# Patient Record
Sex: Male | Born: 1960 | Race: White | Hispanic: No | Marital: Married | Smoking: Former smoker
Health system: Southern US, Community
[De-identification: ages and names within clinical notes are randomized; demographics above are authoritative.]

## PROBLEM LIST (undated history)

## (undated) DIAGNOSIS — G8929 Other chronic pain: Secondary | ICD-10-CM

## (undated) DIAGNOSIS — M25569 Pain in unspecified knee: Secondary | ICD-10-CM

## (undated) DIAGNOSIS — IMO0002 Reserved for concepts with insufficient information to code with codable children: Secondary | ICD-10-CM

## (undated) DIAGNOSIS — M549 Dorsalgia, unspecified: Secondary | ICD-10-CM

## (undated) DIAGNOSIS — E785 Hyperlipidemia, unspecified: Secondary | ICD-10-CM

## (undated) HISTORY — PX: BACK SURGERY: SHX140

## (undated) HISTORY — PX: OTHER SURGICAL HISTORY: SHX169

---

## 2007-08-10 ENCOUNTER — Encounter: Admission: RE | Admit: 2007-08-10 | Discharge: 2007-08-10 | Payer: Self-pay | Admitting: Family Medicine

## 2007-08-10 ENCOUNTER — Ambulatory Visit: Payer: Self-pay | Admitting: Family Medicine

## 2007-08-10 DIAGNOSIS — M545 Low back pain, unspecified: Secondary | ICD-10-CM | POA: Insufficient documentation

## 2007-08-10 DIAGNOSIS — F528 Other sexual dysfunction not due to a substance or known physiological condition: Secondary | ICD-10-CM

## 2007-08-10 DIAGNOSIS — M25569 Pain in unspecified knee: Secondary | ICD-10-CM

## 2007-08-10 DIAGNOSIS — E785 Hyperlipidemia, unspecified: Secondary | ICD-10-CM

## 2007-08-11 ENCOUNTER — Encounter: Payer: Self-pay | Admitting: Family Medicine

## 2007-08-12 ENCOUNTER — Telehealth: Payer: Self-pay | Admitting: Family Medicine

## 2007-08-14 LAB — CONVERTED CEMR LAB
Albumin: 4.5 g/dL (ref 3.5–5.2)
Alkaline Phosphatase: 70 units/L (ref 39–117)
BUN: 17 mg/dL (ref 6–23)
Bilirubin Urine: NEGATIVE
Glucose, Bld: 95 mg/dL (ref 70–99)
HCT: 48.3 % (ref 39.0–52.0)
HDL: 42 mg/dL (ref >39)
Hemoglobin: 16.3 g/dL (ref 13.0–17.0)
LDL Cholesterol: 207 mg/dL — ABNORMAL HIGH (ref 0–99)
MCHC: 33.7 g/dL (ref 30.0–36.0)
MCV: 93.4 fL (ref 78.0–100.0)
Protein, ur: NEGATIVE mg/dL
RBC: 5.17 M/uL (ref 4.22–5.81)
Total Bilirubin: 0.5 mg/dL (ref 0.3–1.2)
Triglycerides: 263 mg/dL — ABNORMAL HIGH (ref <150)
Urine Glucose: NEGATIVE mg/dL
VLDL: 53 mg/dL — ABNORMAL HIGH (ref 0–40)

## 2007-08-22 ENCOUNTER — Telehealth: Payer: Self-pay | Admitting: Family Medicine

## 2007-09-19 ENCOUNTER — Encounter: Payer: Self-pay | Admitting: Family Medicine

## 2008-02-08 ENCOUNTER — Encounter: Admission: RE | Admit: 2008-02-08 | Discharge: 2008-02-08 | Payer: Self-pay | Admitting: Family Medicine

## 2008-02-08 ENCOUNTER — Ambulatory Visit: Payer: Self-pay | Admitting: Family Medicine

## 2008-02-14 ENCOUNTER — Encounter: Payer: Self-pay | Admitting: Family Medicine

## 2008-02-15 ENCOUNTER — Encounter: Payer: Self-pay | Admitting: Family Medicine

## 2008-04-01 ENCOUNTER — Encounter: Payer: Self-pay | Admitting: Family Medicine

## 2008-04-01 LAB — CONVERTED CEMR LAB
Cholesterol: 315 mg/dL
HDL: 35 mg/dL
Triglyceride fasting, serum: 226 mg/dL

## 2008-04-02 ENCOUNTER — Encounter: Payer: Self-pay | Admitting: Family Medicine

## 2008-04-04 ENCOUNTER — Encounter: Payer: Self-pay | Admitting: Family Medicine

## 2009-03-25 ENCOUNTER — Encounter: Admission: RE | Admit: 2009-03-25 | Discharge: 2009-03-25 | Payer: Self-pay | Admitting: Family Medicine

## 2009-03-25 ENCOUNTER — Ambulatory Visit: Payer: Self-pay | Admitting: Family Medicine

## 2009-03-25 DIAGNOSIS — K5900 Constipation, unspecified: Secondary | ICD-10-CM | POA: Insufficient documentation

## 2009-04-04 ENCOUNTER — Encounter: Payer: Self-pay | Admitting: Family Medicine

## 2009-04-18 ENCOUNTER — Encounter: Payer: Self-pay | Admitting: Family Medicine

## 2009-05-10 ENCOUNTER — Encounter: Payer: Self-pay | Admitting: Family Medicine

## 2009-05-13 ENCOUNTER — Encounter: Payer: Self-pay | Admitting: Family Medicine

## 2009-05-17 ENCOUNTER — Encounter: Payer: Self-pay | Admitting: Family Medicine

## 2009-06-21 ENCOUNTER — Ambulatory Visit: Payer: Self-pay | Admitting: Family Medicine

## 2009-06-21 DIAGNOSIS — I10 Essential (primary) hypertension: Secondary | ICD-10-CM

## 2009-07-22 ENCOUNTER — Encounter: Payer: Self-pay | Admitting: Family Medicine

## 2009-07-23 ENCOUNTER — Ambulatory Visit: Payer: Self-pay | Admitting: Family Medicine

## 2009-07-23 DIAGNOSIS — R42 Dizziness and giddiness: Secondary | ICD-10-CM | POA: Insufficient documentation

## 2009-08-02 ENCOUNTER — Ambulatory Visit: Payer: Self-pay | Admitting: Family Medicine

## 2009-08-02 DIAGNOSIS — J329 Chronic sinusitis, unspecified: Secondary | ICD-10-CM | POA: Insufficient documentation

## 2009-08-29 ENCOUNTER — Encounter: Payer: Self-pay | Admitting: Family Medicine

## 2009-09-18 ENCOUNTER — Encounter: Payer: Self-pay | Admitting: Family Medicine

## 2009-11-07 ENCOUNTER — Ambulatory Visit: Payer: Self-pay | Admitting: Family Medicine

## 2009-11-11 ENCOUNTER — Encounter: Payer: Self-pay | Admitting: Family Medicine

## 2009-11-12 LAB — CONVERTED CEMR LAB
ALT: 24 units/L (ref 0–53)
AST: 17 units/L (ref 0–37)
Alkaline Phosphatase: 73 units/L (ref 39–117)
CO2: 25 meq/L (ref 19–32)
LDL Cholesterol: 112 mg/dL — ABNORMAL HIGH (ref 0–99)
Sodium: 140 meq/L (ref 135–145)
Total Bilirubin: 0.4 mg/dL (ref 0.3–1.2)
Total Protein: 6.9 g/dL (ref 6.0–8.3)
VLDL: 59 mg/dL — ABNORMAL HIGH (ref 0–40)

## 2009-11-19 ENCOUNTER — Telehealth: Payer: Self-pay | Admitting: Family Medicine

## 2009-11-20 ENCOUNTER — Encounter: Payer: Self-pay | Admitting: Family Medicine

## 2009-12-11 ENCOUNTER — Ambulatory Visit: Payer: Self-pay | Admitting: Diagnostic Radiology

## 2009-12-11 ENCOUNTER — Emergency Department (HOSPITAL_BASED_OUTPATIENT_CLINIC_OR_DEPARTMENT_OTHER)
Admission: EM | Admit: 2009-12-11 | Discharge: 2009-12-11 | Payer: Self-pay | Source: Home / Self Care | Admitting: Emergency Medicine

## 2010-02-07 ENCOUNTER — Encounter: Payer: Self-pay | Admitting: Family Medicine

## 2010-02-10 LAB — CONVERTED CEMR LAB
BUN: 11 mg/dL (ref 6–23)
Chloride: 105 meq/L (ref 96–112)
Cholesterol: 166 mg/dL (ref 0–200)
Creatinine, Ser: 1.02 mg/dL (ref 0.40–1.50)
Glucose, Bld: 81 mg/dL (ref 70–99)
HDL: 40 mg/dL (ref >39)
Potassium: 4.4 meq/L (ref 3.5–5.3)
Sodium: 140 meq/L (ref 135–145)
Total Bilirubin: 0.5 mg/dL (ref 0.3–1.2)
Total CHOL/HDL Ratio: 4.2
Total Protein: 6.3 g/dL (ref 6.0–8.3)
Triglycerides: 174 mg/dL — ABNORMAL HIGH (ref <150)
VLDL: 35 mg/dL (ref 0–40)

## 2010-02-25 NOTE — Assessment & Plan Note (Addendum)
Summary: FOOT PAIN, constiapation, chol   Vital Signs:  Patient profile:   50 year old male Height:      71 inches Weight:      223 pounds BMI:     31.21 Pulse rate:   104 / minute BP sitting:   139 / 80  (left arm) Cuff size:   large  Vitals Entered By: Kathlene November (March 25, 2009 10:29 AM) CC: left foot pain below 2nd digit - shooting pain x 1 month now   Primary Care Provider:  Nani Gasser MD  CC:  left foot pain below 2nd digit - shooting pain x 1 month now.  History of Present Illness: left foot pain below 2nd digit - shooting pain x 1 month now.  Notices some "achey" on his foot. worse wtih walking and better with rest.  Now worse when flexes forefoot and then gets shooting pain into the 2nd digit.  Worse at night when takes his boots off after work. Using Tyelnol, IBU and BC powser with no real relief. No tinglig or numbness, redness or swelling. No known trauma.  No hx of gout. Never had this before.   Constipation since October.  Notice what he thought was a tapeworm after BM while wiping.  Hasn't noticed it 3 times.  Having to use a laxative twice a week. Used a water enema. No blood in the stool.  Will get some abdominal pain after not going for a few days.   Current Medications (verified): 1)  None  Allergies (verified): 1)  ! Biaxin 2)  ! Celebrex  Comments:  Nurse/Medical Assistant: The patient's medications and allergies were reviewed with the patient and were updated in the Medication and Allergy Lists. Kathlene November (March 25, 2009 10:31 AM)  Physical Exam  General:  Well-developed,well-nourished,in no acute distress; alert,appropriate and cooperative throughout examination Lungs:  Normal respiratory effort, chest expands symmetrically. Lungs are clear to auscultation, no crackles or wheezes. Heart:  Normal rate and regular rhythm. S1 and S2 normal without gallop, murmur, click, rub or other extra sounds. Abdomen:  Bowel sounds positive,abdomen  soft and non-tender without masses, organomegaly or hernias noted. Msk:  Left foot with no deformity, redness or swelling.  Ankel wtih NROM and strength 5/5.  Great toe with strength 5/5.  Tender at the base of teh 2nd MTP and inbetweent the 2nd adn 3rd MTP.   Pulses:  DP adn post tib 2+ on the left foot.  Extremities:  NO ankle edema.    Impression & Recommendations:  Problem # 1:  FOOT PAIN (ICD-729.5) Assessment New Will get xray to rule out stress fracture since tender over teh bone.  also tender inbetween teh MTPs which coudl indidcate a neuroma.  If xray + will treat if neg will refer to podiatry. If needs to refer to Podiatry, prefers Dr. Yates Decamp.  Since NSAIDS not really helping then will try tramadol. Call if not relieving his pain. Want to avoid narcotics while he is working.   Orders: T-*Unlisted Diagnostic X-ray test/procedure ([address])  Problem # 2:  CONSTIPATION (ICD-564.00) Assessment: New  Try to wean laxative and will start miralax. Will refer to GI for further evluation since due for his screenking colonoscopy at the end of the month. As far a possible parasite this is unlikely but will also have GI evaluate him as well.   Orders: Gastroenterology Referral (GI)  Problem # 3:  SCREENING FOR LIPOID DISORDERS (ICD-V77.91) Due to recheck.  Orders: T-Comprehensive Metabolic Panel [id number])  T-Lipid Profile [id number])  Complete Medication List: 1)  Simvastatin 40 Mg Tabs (Simvastatin) .... Take 1 tablet by mouth once a day at bedtime 2)  Tramadol Hcl 50 Mg Tabs (Tramadol hcl) .... Take 1 tablet by mouth three times a day as needed for severe pain.  Patient Instructions: 1)  Miralax daily until normal soft bowel movement.  2)  We will make referral to Gastroenterology.  Prescriptions: TRAMADOL HCL 50 MG TABS (TRAMADOL HCL) Take 1 tablet by mouth three times a day as needed for severe pain.  #45 x 0   Entered and Authorized by:   Nani Gasser MD   Signed  by:   Nani Gasser MD on 03/25/2009   Method used:   Electronically to        [facility]* (retail)       [address] [place], [place]  [address]       Ph: [phone]       Fax: [phone]   RxID:   [id number] SIMVASTATIN 40 MG  TABS (SIMVASTATIN) Take 1 tablet by mouth once a day at bedtime  #30 x 6   Entered and Authorized by:   Nani Gasser MD   Signed by:   Nani Gasser MD on 03/25/2009   Method used:   Electronically to        [facility]* (retail)       [address] [place], [place]  [address]       Ph: [phone]       Fax: [phone]   RxID:   [id number]

## 2010-02-25 NOTE — Assessment & Plan Note (Addendum)
Summary: f/u sinusitis/ vertigo   Vital Signs:  Patient profile:   50 year old male Height:      71 inches Weight:      231 pounds BMI:     32.33 O2 Sat:      97 % on Room air Pulse rate:   110 / minute BP sitting:   143 / 79  (right arm) Cuff size:   large  Vitals Entered By: Payton Spark CMA (August 02, 2009 1:50 PM)  O2 Flow:  Room air CC: F/U    Primary Care Provider:  Nani Gasser MD  CC:  F/U .  History of Present Illness: 50 yo WM presents for f/u sinusitis and vertigo.  In June, he had a CT of the scan that showed a normal brain but sinusitis in the ethmoids, sphenoids and maxillary sinuses.  he completed Augmentin x 7 days as given by the ED and partially cleared but symptoms are returning.  He has been using Claritin D on and off.  He continues to have tinnitus and vertigo on and off.  Has not seen ENT before.    Has no rhinorrhea but had head congestion and some HAs.  Has some postnasal drip.    Current Medications (verified): 1)  Simvastatin 40 Mg  Tabs (Simvastatin) .... Take 1 Tablet By Mouth Once A Day At Bedtime 2)  Enalapril Maleate 5 Mg Tabs (Enalapril Maleate) .Marland Kitchen.. 1 Tab By Mouth Daily  Allergies (verified): 1)  ! Biaxin 2)  ! Celebrex  Past History:  Past Medical History: Reviewed history from 07/23/2009 and no changes required. Head CT neg 3/21010 Carotid US neg 03/2008 [facility]  CT head 06-2009: worsening paranasal sinus dz with partial opacification of the ethmoidal, maxillary and sphenoidal sinuses.    Past Surgical History: Reviewed history from 08/10/2007 and no changes required. Lumbar surgery 1983, 1986 Foot surgery 2002 Cholecystectomy 1997  Social History: Reviewed history from 08/10/2007 and no changes required. 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   Current Smoker Alcohol use-no Drug use-no Regular exercise-no  Review of Systems      See HPI  Physical Exam  General:  alert,  well-developed, well-nourished, and well-hydrated.   Head:  normocephalic and atraumatic.  sinuses NTTP Eyes:  conjunctiva clear, wearing glasses Ears:  EACs patent; TMs translucent and gray with good cone of light and bony landmarks.  Nose:  no nasal discharge.   Mouth:  pharynx pink and moist.   Neck:  no masses.   Lungs:  Normal respiratory effort, chest expands symmetrically. Lungs are clear to auscultation, no crackles or wheezes. Heart:  Normal rate and regular rhythm. S1 and S2 normal without gallop, murmur, click, rub or other extra sounds. Neurologic:  2 beats of horizontal nystagmus Skin:  color normal.   Cervical Nodes:  No lymphadenopathy noted   Impression & Recommendations:  Problem # 1:  SINUSITIS, CHRONIC (ICD-473.9) Treat with a 2nd course of abx - Cefdinir x 14 days.  Add Omnaris nasal spray.  Will likely need a limited CT of the sinuses but will have him evaluated by ENT since he also has ongoing vertigo and tinnitus. His updated medication list for this problem includes:    Cefdinir 300 Mg Caps (Cefdinir) .Marland Kitchen... 1 capsule by mouth q 12 hrs x 14 days    Omnaris 50 Mcg/act Susp (Ciclesonide) .Marland Kitchen... 2 sprays per nostril once a day  Orders: ENT Referral (ENT)  Problem #  2:  VERTIGO (ICD-780.4) Still occuring on and off.  Had a neg Gilberto Better last visit and a normal CT brain in the ED.  Since he also has chronic sinusitis and tinnitus, eval by ENT is appropriate.  RFd Antivert and avoid driving. His updated medication list for this problem includes:    Antivert 25 Mg Tabs (Meclizine hcl) .Marland Kitchen... 1 tab by mouth q 6 hrs as needed dizziness  Orders: ENT Referral (ENT)  Complete Medication List: 1)  Simvastatin 40 Mg Tabs (Simvastatin) .... Take 1 tablet by mouth once a day at bedtime 2)  Enalapril Maleate 5 Mg Tabs (Enalapril maleate) .Marland Kitchen.. 1 tab by mouth daily 3)  Antivert 25 Mg Tabs (Meclizine hcl) .Marland Kitchen.. 1 tab by mouth q 6 hrs as needed dizziness 4)  Cefdinir 300 Mg  Caps (Cefdinir) .Marland Kitchen.. 1 capsule by mouth q 12 hrs x 14 days 5)  Omnaris 50 Mcg/act Susp (Ciclesonide) .... 2 sprays per nostril once a day  Patient Instructions: 1)  Start Cefdinir with breakfast and dinner x 2 wks for sinusiitis. 2)  Use Omnaris  2 sprays/ nostril daily. 3)  Use Antivert as needed dizziness. 4)  Will get you in with ENT for chronic sinsuitis and prolonged vertigo. Prescriptions: ENALAPRIL MALEATE 5 MG TABS (ENALAPRIL MALEATE) 1 tab by mouth daily  #30 x 3   Entered and Authorized by:   Seymour Bars DO   Signed by:   Seymour Bars DO on 08/02/2009   Method used:   Electronically to        [facility]* (retail)       [address] [place], [place]  [address]       Ph: [phone]       Fax: [phone]   RxID:   [id number] OMNARIS 50 MCG/ACT SUSP (CICLESONIDE) 2 sprays per nostril once a day  #1 bottle x 1   Entered and Authorized by:   Seymour Bars DO   Signed by:   Seymour Bars DO on 08/02/2009   Method used:   Printed then faxed to ...       [facility]* (retail)       [address] [place], [place]  [address]       Ph: [phone]       Fax: [phone]   RxID:   [id number] CEFDINIR 300 MG CAPS (CEFDINIR) 1 capsule by mouth q 12 hrs x 14 days  #28 x 0   Entered and Authorized by:   Seymour Bars DO   Signed by:   Seymour Bars DO on 08/02/2009   Method used:   Electronically to        [facility]* (retail)       [address] [place], [place]  [address]       Ph: [phone]       Fax: [phone]   RxID:   [id number] ANTIVERT 25 MG TABS (MECLIZINE HCL) 1 tab by mouth q 6 hrs as needed dizziness  #24 x 0   Entered and Authorized by:   Seymour Bars DO   Signed by:   Seymour Bars DO on 08/02/2009   Method used:   Electronically to        [facility]* (retail)       [address] [place],  [place]  [address]       Ph: [phone]       Fax: [phone]   RxID:   [id number]

## 2010-02-25 NOTE — Consult Note (Addendum)
Summary: [facility]  [facility]   Imported By: Lanelle Bal 04/16/2009 10:11:24  _____________________________________________________________________  External Attachment:    Type:   Image     Comment:   External Document

## 2010-02-25 NOTE — Procedures (Addendum)
Summary: Colonoscopy   Colonoscopy Result Date:  05/10/2009 Colonoscopy Result:  normal Hemoccult Next Due:  Not Indicated  Performed at [facility].   Appended Document: Colonoscopy  Flex Sig Next Due:  Not Indicated Last Colonoscopy:  normal (05/10/2009 11:44:04 AM) Colonoscopy Next Due:  5 yr

## 2010-02-25 NOTE — Consult Note (Addendum)
Summary: [facility]  [facility]   Imported By: Lanelle Bal 04/25/2009 11:01:13  _____________________________________________________________________  External Attachment:    Type:   Image     Comment:   External Document

## 2010-02-25 NOTE — Letter (Addendum)
Summary: Out of Work  [facility]  [address], [address]   [place], [place] [address]   Phone: [phone]  Fax: [phone]    July 23, 2009   Employee:  ILAI HILLER [employer] [facility] [employer]    To Whom It May Concern:   For Medical reasons, please excuse the above named employee from work for the following dates:  Start:   June 28th - 29th  End:   June 30th  If you need additional information, please feel free to contact our office.         Sincerely,    Seymour Bars DO

## 2010-02-25 NOTE — Letter (Addendum)
Summary: Letter with Path Results/[facility]  Letter with Path Results/[facility]   Imported By: Lanelle Bal 05/23/2009 11:59:34  _____________________________________________________________________  External Attachment:    Type:   Image     Comment:   External Document

## 2010-02-25 NOTE — Letter (Addendum)
Summary: Letter with Colonoscopy Results/[facility]  Letter with Colonoscopy Results/[facility]   Imported By: Lanelle Bal 05/16/2009 13:57:51  _____________________________________________________________________  External Attachment:    Type:   Image     Comment:   External Document

## 2010-02-25 NOTE — Letter (Addendum)
Summary: [facility]  [facility]   Imported By: Lanelle Bal 10/10/2009 11:39:58  _____________________________________________________________________  External Attachment:    Type:   Image     Comment:   External Document

## 2010-02-25 NOTE — Medication Information (Addendum)
Summary: Approval for Levitra/[employer]  Approval for Levitra/[employer]   Imported By: Lanelle Bal 12/17/2009 11:34:52  _____________________________________________________________________  External Attachment:    Type:   Image     Comment:   External Document

## 2010-02-25 NOTE — Assessment & Plan Note (Addendum)
Summary: ant bites/ HTN   Vital Signs:  Patient profile:   50 year old male Height:      71 inches Weight:      229 pounds BMI:     32.05 O2 Sat:      98 % on Room air Pulse rate:   83 / minute BP sitting:   154 / 93  (left arm) Cuff size:   large  Vitals Entered By: Payton Spark CMA (Jun 21, 2009 9:00 AM)  O2 Flow:  Room air CC: Ant bites L arm x 4 weeks ago but not healing. Also c/o elevated BP x months.    Primary Care Provider:  Nani Gasser MD  CC:  Ant bites L arm x 4 weeks ago but not healing. Also c/o elevated BP x months. .  History of Present Illness: 50 yo WM presents for ant bites on the L arm x 4 wks.  He saw them bite him at work.  He is trying to keep them clean and using Neosporin.  He still has 3 raised lesions on the L arm/ forearm.  Not having much pruritis.  No rash, pus or fevers.    Current Medications (verified): 1)  Simvastatin 40 Mg  Tabs (Simvastatin) .... Take 1 Tablet By Mouth Once A Day At Bedtime  Allergies (verified): 1)  ! Biaxin 2)  ! Celebrex  Past History:  Past Medical History: Reviewed history from 04/04/2008 and no changes required. Head CT neg 3/21010 Carotid US neg 03/2008 at [facility]  Social History: Reviewed history from 08/10/2007 and no changes required. 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   Current Smoker Alcohol use-no Drug use-no Regular exercise-no  Review of Systems  The patient denies fever, chest pain, dyspnea on exertion, and peripheral edema.    Physical Exam  General:  alert, well-developed, well-nourished, and well-hydrated.   Head:  normocephalic and atraumatic.   Skin:  erythematous raised insect bite lesions with central puncture mark, healing along L forearm.  No hyperemia, heat or edema.  No purulent drainage.   Additional Exam:  no epitrochlear LA   Impression & Recommendations:  Problem # 1:  INSECT BITE (ICD-919.4) 4 wks after fire ant bites  and still having some redness, possibly due to exposure to dirty environment at work.  No sign of systemic or spreading infection and not having pruritis.  Clean with dial soap two times a day and apply RX bactroban ointment, keep covered x 10 days.l  Call if not improving.  Problem # 2:  ESSENTIAL HYPERTENSION, BENIGN (ICD-401.1) Assessment: New Pt reports having home BPs 160s/90s over the past 2 mos.  BP high today.  Will treat with Enalapril once a day.  Update fasting labs in 4 wks.  RTC in 6 wks for OV. Call if any problems. His updated medication list for this problem includes:    Enalapril Maleate 5 Mg Tabs (Enalapril maleate) .Marland Kitchen... 1 tab by mouth daily  BP today: 154/93 Prior BP: 139/80 (03/25/2009)  Labs Reviewed: K+: 4.7 (08/11/2007) Creat: : 1.04 (08/11/2007)   Chol: 315 (04/01/2008)   HDL: 35 (04/01/2008)   LDL: 224 (04/01/2008)   TG: 226 (04/01/2008)  Complete Medication List: 1)  Simvastatin 40 Mg Tabs (Simvastatin) .... Take 1 tablet by mouth once a day at bedtime 2)  Enalapril Maleate 5 Mg Tabs (Enalapril maleate) .Marland Kitchen.. 1 tab by mouth daily 3)  Bactroban 2 % Oint (Mupirocin) .... Apply to insect  bites three times a day x 10 days  Other Orders: T-Comprehensive Metabolic Panel [id number]) T-Lipid Profile ([id number]) T-PSA ([id number])  Patient Instructions: 1)  Start Enalapril once a day for high BP. 2)  Call if any problems on the new medicine. 3)  Update FASTING labs in 1 month. 4)  REturn for f/u visit in 6 wks. 5)  Clean ant bites with Dial soap and water 2 x a day. 6)  Dry skin and apply bactroban ointment.  Cover lesions with a bandaid x 10 days. Prescriptions: BACTROBAN 2 % OINT (MUPIROCIN) apply to insect bites three times a day x 10 days  #22 g x 0   Entered and Authorized by:   Seymour Bars DO   Signed by:   Seymour Bars DO on 06/21/2009   Method used:   Electronically to        [facility]* (retail)       [address] [place], [place]   [address]       Ph: [phone]       Fax: [phone]   RxID:   [id number] ENALAPRIL MALEATE 5 MG TABS (ENALAPRIL MALEATE) 1 tab by mouth daily  #30 x 1   Entered and Authorized by:   Seymour Bars DO   Signed by:   Seymour Bars DO on 06/21/2009   Method used:   Electronically to        [facility]* (retail)       [address] [place], [place]  [address]       Ph: [phone]       Fax: [phone]   RxID:   [id number]

## 2010-02-25 NOTE — Progress Notes (Addendum)
Summary: Prior auth  Phone Note Call from Patient Call back at [facility] Phone [phone]   Summary of Call: Pt needs prior auth on Levitra done. Has [employer] Initial call taken by: Kathlene November LPN,  November 19, 2009 9:47 AM  Follow-up for Phone Call        medication has been approved for 1 year from today date. 2592 is ref num.pt notified Follow-up by: Avon Gully CMA, Duncan Dull),  November 20, 2009 2:57 PM

## 2010-02-25 NOTE — Assessment & Plan Note (Addendum)
Summary: sinusitis/ vertigo   Vital Signs:  Patient profile:   50 year old male Height:      71 inches Weight:      227 pounds BMI:     31.77 O2 Sat:      97 % on Room air Pulse rate:   99 / minute BP sitting:   118 / 80  (left arm) Cuff size:   large  Vitals Entered By: Payton Spark CMA (July 23, 2009 9:56 AM)  O2 Flow:  Room air CC: F/U ED for sinusitis   Primary Care Provider:  Nani Gasser MD  CC:  F/U ED for sinusitis.  History of Present Illness: 50 yo WM presents for dizziness that started on Saturday.  Felt worse yesterday.  Worse with movement of his head.  He had nausea yesterday.  He has not had any head congestion.  He had sinusitis on CT of the head at the ED last night.  He has not had fever or chills.  The ED sent him home with Augmentin x 7 days and Antivert.  He has some vague tinnitus and questionable hearing loss but not hx of meniere's dz.  saw ENT years ago for chronic sinusitis.   Current Medications (verified): 1)  Simvastatin 40 Mg  Tabs (Simvastatin) .... Take 1 Tablet By Mouth Once A Day At Bedtime 2)  Enalapril Maleate 5 Mg Tabs (Enalapril Maleate) .Marland Kitchen.. 1 Tab By Mouth Daily  Allergies (verified): 1)  ! Biaxin 2)  ! Celebrex  Past History:  Past Medical History: Head CT neg 3/21010 Carotid US neg 03/2008 at [facility]  CT head 06-2009: worsening paranasal sinus dz with partial opacification of the ethmoidal, maxillary and sphenoidal sinuses.    Past Surgical History: Reviewed history from 08/10/2007 and no changes required. Lumbar surgery 1983, 1986 Foot surgery 2002 Cholecystectomy 1997  Social History: Reviewed history from 08/10/2007 and no changes required. 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   Current Smoker Alcohol use-no Drug use-no Regular exercise-no  Review of Systems      See HPI  Physical Exam  General:  alert, well-developed, well-nourished, and well-hydrated.   Head:   normocephalic and atraumatic.   Eyes:  EOMI with 1 beat of horizontal nystagmus.  PERRLA Ears:  EACs patent; TMs translucent and gray with good cone of light and bony landmarks.  Nose:  nasal congestion w/o rhinorrhea Mouth:  pharynx pink and moist.   Neck:  supple, full ROM, and no masses.   Lungs:  Normal respiratory effort, chest expands symmetrically. Lungs are clear to auscultation, no crackles or wheezes. Heart:  Normal rate and regular rhythm. S1 and S2 normal without gallop, murmur, click, rub or other extra sounds. Neurologic:  negative Dix Hallpike maneuver Skin:  color normal.   Cervical Nodes:  No lymphadenopathy noted Psych:  good eye contact, not anxious appearing, and not depressed appearing.     Impression & Recommendations:  Problem # 1:  ACUTE SINUSITIS, UNSPECIFIED (ICD-461.9) Confirmed on CT scan at the ED last night- in the ethmoid, maxillary and sphenoids. On Augmentin day 1/ 7.  He is to use Claritin - D for underlying allergies/ congestion.   Complicated hx of allergies (untreated) and chronic sinusitis. Repeat CT sinuses in 10 days if not cleared and refer to ENT if not improved.  Problem # 2:  VERTIGO (ICD-780.4) Likely secondary to head congestion/ sinusitis.  Has RX for Antivert by the ED last night.  Normal Dix Hallpike maneuver. Has some tinnitus and ? hearing loss--> refer to ENT if not cleared in 7 days.  Complete Medication List: 1)  Simvastatin 40 Mg Tabs (Simvastatin) .... Take 1 tablet by mouth once a day at bedtime 2)  Enalapril Maleate 5 Mg Tabs (Enalapril maleate) .Marland Kitchen.. 1 tab by mouth daily  Patient Instructions: 1)  Call if dizziness/ head congestion not improved in 7-10 days. 2)  Finish out Augmentin. 3)  Use Antivert as needed for vertigo. 4)  Take it easy. 5)  Drink plenty of water. 6)  Use OTC Claritin D (instead of plain Sudafed)-- get this from pharmacy counter. 7)  Out of work today and tomorrow.

## 2010-02-25 NOTE — Letter (Addendum)
Summary: [facility]  [facility]   Imported By: Lanelle Bal 12/04/2009 11:09:35  _____________________________________________________________________  External Attachment:    Type:   Image     Comment:   External Document

## 2010-02-25 NOTE — Assessment & Plan Note (Addendum)
Summary: ED, HTN, constipation, etc.    Vital Signs:  Patient profile:   50 year old male Height:      71 inches Weight:      230 pounds Pulse rate:   94 / minute BP sitting:   93 / 61  (right arm) Cuff size:   large  Vitals Entered By: Avon Gully CMA, Duncan Dull) (November 07, 2009 10:44 AM) CC: pt thinks he has worms,sees them in his stool, had a colonoscopy last year because of the  same concern and colonoscopy showed nothing   Primary Care Provider:  Nani Gasser MD  CC:  pt thinks he has worms, sees them in his stool, and had a colonoscopy last year because of the  same concern and colonoscopy showed nothing.  History of Present Illness: Saw Dr. Minerva Areola for his vertigo this AM. ON teh valium two times a day. Has next f/u in 4 months.  He feels this is really helping.   pt thinks he has worms, sees them in his stool, had a colonoscopy last year because of the  same concern and colonoscopy showed nothing. He thinks this is why will get constipated. Will feel a kicking sensation on the left side of the abdomen. Notices the worms a couple of times a month.  Usint the senna-S once a week. Taking a fiber supplement daily.   Current Medications (verified): 1)  Simvastatin 40 Mg  Tabs (Simvastatin) .... Take 1 Tablet By Mouth Once A Day At Bedtime 2)  Enalapril Maleate 5 Mg Tabs (Enalapril Maleate) .Marland Kitchen.. 1 Tab By Mouth Daily 3)  Valium 5 Mg Tabs (Diazepam) .... Take One Tablet By Mouth Twice Daily  Allergies (verified): 1)  ! Biaxin 2)  ! Celebrex  Comments:  Nurse/Medical Assistant: The patient's medications and allergies were reviewed with the patient and were updated in the Medication and Allergy Lists. Avon Gully CMA, Duncan Dull) (November 07, 2009 10:46 AM)  Physical Exam  General:  Well-developed,well-nourished,in no acute distress; alert,appropriate and cooperative throughout examination Lungs:  Normal respiratory effort, chest expands symmetrically. Lungs are clear to  auscultation, no crackles or wheezes. Heart:  Normal rate and regular rhythm. S1 and S2 normal without gallop, murmur, click, rub or other extra sounds.   Impression & Recommendations:  Problem # 1:  ESSENTIAL HYPERTENSION, BENIGN (ICD-401.1) discussed that his BP looks so good that we will stop the enalapril. Recheck pressure in one month off the med.  Will get screening labs and cholesterol check as well.  His updated medication list for this problem includes:    Enalapril Maleate 5 Mg Tabs (Enalapril maleate) .Marland Kitchen... 1 tab by mouth daily  Orders: T-Comprehensive Metabolic Panel [id number]) T-Lipid Profile (95621-30865)  Problem # 2:  HYPERLIPIDEMIA (ICD-272.4) Due to recheck to make sure at [facility].  His updated medication list for this problem includes:    Simvastatin 40 Mg Tabs (Simvastatin) .Marland Kitchen... Take 1 tablet by mouth once a day at bedtime  Orders: T-Comprehensive Metabolic Panel [id number]) T-Lipid Profile ([id number])  Problem # 3:  ERECTILE DYSFUNCTION (ICD-302.72) Will try levitra. Didn't thingk the cialis worked well for him. Rec stop smoking and aovid alcohol.  His updated medication list for this problem includes:    Levitra 10 Mg Tabs (Vardenafil hcl) .Marland Kitchen... Take 1 tablet by mouth once a day  Problem # 4:  CONSTIPATION (ICD-564.00) He really thinks this is from worms. Discussed that he may haIve parasitosis. I agree that if he can collect one of these specimens in  a cup (gave him a couple of sterile cups), will send for evaluation and if neg then he will consider that he may actually have parasitosis and benefit  from treatment with medications.  Continue current bowel regimen that seem to be helping him.   Complete Medication List: 1)  Simvastatin 40 Mg Tabs (Simvastatin) .... Take 1 tablet by mouth once a day at bedtime 2)  Enalapril Maleate 5 Mg Tabs (Enalapril maleate) .Marland Kitchen.. 1 tab by mouth daily 3)  Valium 5 Mg Tabs (Diazepam) .... Take one tablet by mouth twice  daily 4)  Levitra 10 Mg Tabs (Vardenafil hcl) .... Take 1 tablet by mouth once a day  Patient Instructions: 1)  Stop the enalapril and lets recheck your blood pressure in one month.  2)  Drop off stool samples when collect them.  Prescriptions: LEVITRA 10 MG TABS (VARDENAFIL HCL) Take 1 tablet by mouth once a day  #6 x 0   Entered and Authorized by:   Nani Gasser MD   Signed by:   Nani Gasser MD on 11/07/2009   Method used:   Electronically to        [facility]* (retail)       [address] [place], [place]  [address]       Ph: [phone]       Fax: [phone]   RxID:   [id number] SIMVASTATIN 40 MG  TABS (SIMVASTATIN) Take 1 tablet by mouth once a day at bedtime  #30 x 6   Entered and Authorized by:   Nani Gasser MD   Signed by:   Nani Gasser MD on 11/07/2009   Method used:   Electronically to        [facility]* (retail)       [address] [place], [place]  [address]       Ph: [phone]       Fax: [phone]   RxID:   [id number]

## 2010-02-28 NOTE — Letter (Addendum)
Summary: [facility]  [facility]   Imported By: Lanelle Bal 09/17/2009 11:42:40  _____________________________________________________________________  External Attachment:    Type:   Image     Comment:   External Document

## 2010-03-13 ENCOUNTER — Encounter: Payer: Self-pay | Admitting: Family Medicine

## 2010-04-15 NOTE — Letter (Signed)
Summary: [facility]  [facility]   Imported By: Maryln Gottron 04/07/2010 15:58:01  _____________________________________________________________________  External Attachment:    Type:   Image     Comment:   External Document

## 2010-07-03 ENCOUNTER — Emergency Department (HOSPITAL_BASED_OUTPATIENT_CLINIC_OR_DEPARTMENT_OTHER)
Admission: EM | Admit: 2010-07-03 | Discharge: 2010-07-03 | Disposition: A | Discharge status: Home / Self Care | Payer: Worker's Compensation | Attending: Emergency Medicine | Admitting: Emergency Medicine

## 2010-07-03 ENCOUNTER — Emergency Department (INDEPENDENT_AMBULATORY_CARE_PROVIDER_SITE_OTHER): Payer: Worker's Compensation

## 2010-07-03 DIAGNOSIS — M5137 Other intervertebral disc degeneration, lumbosacral region: Secondary | ICD-10-CM | POA: Insufficient documentation

## 2010-07-03 DIAGNOSIS — M51379 Other intervertebral disc degeneration, lumbosacral region without mention of lumbar back pain or lower extremity pain: Secondary | ICD-10-CM | POA: Insufficient documentation

## 2010-07-03 DIAGNOSIS — E7800 Pure hypercholesterolemia, unspecified: Secondary | ICD-10-CM | POA: Insufficient documentation

## 2010-07-03 DIAGNOSIS — M545 Low back pain: Secondary | ICD-10-CM

## 2010-07-03 DIAGNOSIS — X500XXA Overexertion from strenuous movement or load, initial encounter: Secondary | ICD-10-CM

## 2011-03-04 ENCOUNTER — Other Ambulatory Visit: Payer: Self-pay | Admitting: Unknown Physician Specialty

## 2011-03-04 ENCOUNTER — Ambulatory Visit
Admission: RE | Admit: 2011-03-04 | Discharge: 2011-03-04 | Disposition: A | Discharge status: Home / Self Care | Payer: BC Managed Care – PPO | Source: Ambulatory Visit | Attending: Unknown Physician Specialty | Admitting: Unknown Physician Specialty

## 2011-03-04 DIAGNOSIS — R05 Cough: Secondary | ICD-10-CM

## 2012-03-29 ENCOUNTER — Emergency Department (HOSPITAL_BASED_OUTPATIENT_CLINIC_OR_DEPARTMENT_OTHER)
Admission: EM | Admit: 2012-03-29 | Discharge: 2012-03-29 | Disposition: A | Discharge status: Home / Self Care | Payer: BC Managed Care – PPO | Attending: Emergency Medicine | Admitting: Emergency Medicine

## 2012-03-29 ENCOUNTER — Encounter (HOSPITAL_BASED_OUTPATIENT_CLINIC_OR_DEPARTMENT_OTHER): Payer: Self-pay | Admitting: Family Medicine

## 2012-03-29 DIAGNOSIS — W57XXXA Bitten or stung by nonvenomous insect and other nonvenomous arthropods, initial encounter: Secondary | ICD-10-CM

## 2012-03-29 DIAGNOSIS — Z8739 Personal history of other diseases of the musculoskeletal system and connective tissue: Secondary | ICD-10-CM | POA: Insufficient documentation

## 2012-03-29 DIAGNOSIS — R21 Rash and other nonspecific skin eruption: Secondary | ICD-10-CM

## 2012-03-29 DIAGNOSIS — Z79899 Other long term (current) drug therapy: Secondary | ICD-10-CM | POA: Insufficient documentation

## 2012-03-29 DIAGNOSIS — Y929 Unspecified place or not applicable: Secondary | ICD-10-CM | POA: Insufficient documentation

## 2012-03-29 DIAGNOSIS — S30860A Insect bite (nonvenomous) of lower back and pelvis, initial encounter: Secondary | ICD-10-CM | POA: Insufficient documentation

## 2012-03-29 DIAGNOSIS — S30861A Insect bite (nonvenomous) of abdominal wall, initial encounter: Secondary | ICD-10-CM

## 2012-03-29 DIAGNOSIS — R51 Headache: Secondary | ICD-10-CM | POA: Insufficient documentation

## 2012-03-29 DIAGNOSIS — F17200 Nicotine dependence, unspecified, uncomplicated: Secondary | ICD-10-CM | POA: Insufficient documentation

## 2012-03-29 DIAGNOSIS — Y939 Activity, unspecified: Secondary | ICD-10-CM | POA: Insufficient documentation

## 2012-03-29 DIAGNOSIS — R509 Fever, unspecified: Secondary | ICD-10-CM | POA: Insufficient documentation

## 2012-03-29 HISTORY — DX: Pain in unspecified knee: M25.569

## 2012-03-29 HISTORY — DX: Dorsalgia, unspecified: M54.9

## 2012-03-29 HISTORY — DX: Other chronic pain: G89.29

## 2012-03-29 MED ORDER — DOXYCYCLINE HYCLATE 100 MG PO CAPS: 100.0000 mg | ORAL_CAPSULE | Freq: Two times a day (BID) | ORAL | Status: DC

## 2012-03-29 NOTE — ED Provider Notes (Signed)
History    [facility] CSN: [id number]  Arrival date & time 03/29/12  4098   First MD Initiated Contact with Patient 03/29/12 [phone]      Chief Complaint  Patient presents with  . Insect Bite    (Consider location/radiation/quality/duration/timing/severity/associated sxs/prior treatment) HPI 52 year old man who works for the [employer] comes in today complaining that he had a tick bites at an unknown time. He found a tick in the right thigh/groin area on February 27 and removed it. He began having headache and some body aches on March 1. On March 2 he noticed some rash to his right upper arm. He had a temperature to 101 that day. He has not taken antipyretics. Headache is mild to moderate. There is no neck stiffness. He does not have chest pain or cough. He is feeding without difficulty and has not having nausea vomiting or diarrhea. He has some redness around the site of the tick bite. Past Medical History  Diagnosis Date  . Chronic back pain   . Knee pain     No past surgical history on file.  No family history on file.  History  Substance Use Topics  . Smoking status: Current Every Day Smoker  . Smokeless tobacco: Not on file  . Alcohol Use: No      Review of Systems  Constitutional: Positive for fever.  Skin: Positive for rash.  Neurological: Positive for headaches.  All other systems reviewed and are negative.    Allergies  Celecoxib and Clarithromycin  Home Medications   Current Outpatient Rx  Name  Route  Sig  Dispense  Refill  . diazepam (VALIUM) 5 MG/ML solution   Oral   Take 5 mg by mouth every 8 (eight) hours as needed.         . OXYCODONE HCL PO   Oral   Take by mouth.         . TiZANidine HCl (ZANAFLEX PO)   Oral   Take by mouth.           BP 151/86  Pulse 90  Temp(Src) 97.8 F (36.6 C) (Oral)  Resp 16  SpO2 97%  Physical Exam  Nursing note and vitals reviewed. Constitutional: He is oriented to  person, place, and time. He appears well-developed and well-nourished.  HENT:  Head: Normocephalic and atraumatic.  Right Ear: External ear normal.  Left Ear: External ear normal.  Nose: Nose normal.  Mouth/Throat: Oropharynx is clear and moist.  Eyes: Conjunctivae and EOM are normal. Pupils are equal, round, and reactive to light.  Neck: Normal range of motion. Neck supple.  Cardiovascular: Normal rate, regular rhythm, normal heart sounds and intact distal pulses.   Pulmonary/Chest: Effort normal and breath sounds normal. No respiratory distress. He has no wheezes. He exhibits no tenderness.  Abdominal: Soft. Bowel sounds are normal. He exhibits no distension and no mass. There is no tenderness. There is no guarding.  Musculoskeletal: Normal range of motion.  Neurological: He is alert and oriented to person, place, and time. He has normal reflexes. He exhibits normal muscle tone. Coordination normal.  Skin: Skin is warm and dry.  Spotty erythematous rash right upper arm. It blanches. No palmar rash noted. No central clearing. Right groin circular erythematous area 1 cm diameter consistent with tick bite.  Psychiatric: He has a normal mood and affect. His behavior is normal. Judgment and thought content normal.    ED Course  Procedures (including critical care  time)  Labs Reviewed - No data to display No results found.   No diagnosis found.    MDM  Patient will be treated for tick-related illnesses specifically [facility] spotted fever with doxycycline. He is advised to followup with the cough Dr. at work for evaluation in 2-3 days so they can assess whether or not he has improved and returned to work. He is not febrile here and appears well.       Hilario Quarry, MD 03/29/12 [phone]

## 2012-03-29 NOTE — ED Notes (Addendum)
Pt sts he pulled a tick off his right inner thigh on Feb 27th 2014. Pt c/o headache and "sweats". Pt also c/o rash to right upper arm.

## 2012-11-03 ENCOUNTER — Other Ambulatory Visit: Payer: Self-pay | Admitting: Family Medicine

## 2012-11-03 DIAGNOSIS — R05 Cough: Secondary | ICD-10-CM

## 2012-11-04 ENCOUNTER — Ambulatory Visit (INDEPENDENT_AMBULATORY_CARE_PROVIDER_SITE_OTHER): Payer: BC Managed Care – PPO

## 2012-11-04 DIAGNOSIS — R0989 Other specified symptoms and signs involving the circulatory and respiratory systems: Secondary | ICD-10-CM

## 2012-11-04 DIAGNOSIS — R062 Wheezing: Secondary | ICD-10-CM

## 2012-11-04 DIAGNOSIS — R05 Cough: Secondary | ICD-10-CM

## 2013-05-16 ENCOUNTER — Ambulatory Visit (INDEPENDENT_AMBULATORY_CARE_PROVIDER_SITE_OTHER): Payer: BC Managed Care – PPO

## 2013-05-16 ENCOUNTER — Other Ambulatory Visit: Payer: Self-pay | Admitting: Family Medicine

## 2013-05-16 DIAGNOSIS — M5030 Other cervical disc degeneration, unspecified cervical region: Secondary | ICD-10-CM

## 2013-05-16 DIAGNOSIS — R200 Anesthesia of skin: Secondary | ICD-10-CM

## 2013-07-06 ENCOUNTER — Emergency Department (HOSPITAL_BASED_OUTPATIENT_CLINIC_OR_DEPARTMENT_OTHER): Payer: Worker's Compensation

## 2013-07-06 ENCOUNTER — Encounter (HOSPITAL_BASED_OUTPATIENT_CLINIC_OR_DEPARTMENT_OTHER): Payer: Self-pay | Admitting: Emergency Medicine

## 2013-07-06 ENCOUNTER — Emergency Department (HOSPITAL_BASED_OUTPATIENT_CLINIC_OR_DEPARTMENT_OTHER)
Admission: EM | Admit: 2013-07-06 | Discharge: 2013-07-06 | Disposition: A | Discharge status: Home / Self Care | Payer: Worker's Compensation | Attending: Emergency Medicine | Admitting: Emergency Medicine

## 2013-07-06 DIAGNOSIS — Y990 Civilian activity done for income or pay: Secondary | ICD-10-CM | POA: Insufficient documentation

## 2013-07-06 DIAGNOSIS — R52 Pain, unspecified: Secondary | ICD-10-CM

## 2013-07-06 DIAGNOSIS — E7800 Pure hypercholesterolemia, unspecified: Secondary | ICD-10-CM | POA: Insufficient documentation

## 2013-07-06 DIAGNOSIS — Y9389 Activity, other specified: Secondary | ICD-10-CM | POA: Insufficient documentation

## 2013-07-06 DIAGNOSIS — F17200 Nicotine dependence, unspecified, uncomplicated: Secondary | ICD-10-CM | POA: Insufficient documentation

## 2013-07-06 DIAGNOSIS — Z8739 Personal history of other diseases of the musculoskeletal system and connective tissue: Secondary | ICD-10-CM | POA: Insufficient documentation

## 2013-07-06 DIAGNOSIS — G8929 Other chronic pain: Secondary | ICD-10-CM | POA: Insufficient documentation

## 2013-07-06 DIAGNOSIS — X500XXA Overexertion from strenuous movement or load, initial encounter: Secondary | ICD-10-CM | POA: Insufficient documentation

## 2013-07-06 DIAGNOSIS — S93529A Sprain of metatarsophalangeal joint of unspecified toe(s), initial encounter: Secondary | ICD-10-CM | POA: Insufficient documentation

## 2013-07-06 DIAGNOSIS — Z792 Long term (current) use of antibiotics: Secondary | ICD-10-CM | POA: Insufficient documentation

## 2013-07-06 DIAGNOSIS — Y9289 Other specified places as the place of occurrence of the external cause: Secondary | ICD-10-CM | POA: Insufficient documentation

## 2013-07-06 DIAGNOSIS — Z79899 Other long term (current) drug therapy: Secondary | ICD-10-CM | POA: Insufficient documentation

## 2013-07-06 HISTORY — DX: Reserved for concepts with insufficient information to code with codable children: IMO0002

## 2013-07-06 HISTORY — DX: Hyperlipidemia, unspecified: E78.5

## 2013-07-06 MED ORDER — NAPROXEN 500 MG PO TABS: 500.0000 mg | ORAL_TABLET | Freq: Two times a day (BID) | ORAL | Status: DC

## 2013-07-06 NOTE — Discharge Instructions (Signed)
Foot Sprain The muscles and cord like structures which attach muscle to bone (tendons) that surround the feet are made up of units. A foot sprain can occur at the weakest spot in any of these units. This condition is most often caused by injury to or overuse of the foot, as from playing contact sports, or aggravating a previous injury, or from poor conditioning, or obesity. SYMPTOMS  Pain with movement of the foot.  Tenderness and swelling at the injury site.  Loss of strength is present in moderate or severe sprains. THE THREE GRADES OR SEVERITY OF FOOT SPRAIN ARE:  Mild (Grade I): Slightly pulled muscle without tearing of muscle or tendon fibers or loss of strength.  Moderate (Grade II): Tearing of fibers in a muscle, tendon, or at the attachment to bone, with small decrease in strength.  Severe (Grade III): Rupture of the muscle-tendon-bone attachment, with separation of fibers. Severe sprain requires surgical repair. Often repeating (chronic) sprains are caused by overuse. Sudden (acute) sprains are caused by direct injury or over-use. DIAGNOSIS  Diagnosis of this condition is usually by your own observation. If problems continue, a caregiver may be required for further evaluation and treatment. X-rays may be required to make sure there are not breaks in the bones (fractures) present. Continued problems may require physical therapy for treatment. PREVENTION  Use strength and conditioning exercises appropriate for your sport.  Warm up properly prior to working out.  Use athletic shoes that are made for the sport you are participating in.  Allow adequate time for healing. Early return to activities makes repeat injury more likely, and can lead to an unstable arthritic foot that can result in prolonged disability. Mild sprains generally heal in 3 to 10 days, with moderate and severe sprains taking 2 to 10 weeks. Your caregiver can help you determine the proper time required for  healing. HOME CARE INSTRUCTIONS   Apply ice to the injury for 15-20 minutes, 03-04 times per day. Put the ice in a plastic bag and place a towel between the bag of ice and your skin.  An elastic wrap (like an Ace bandage) may be used to keep swelling down.  Keep foot above the level of the heart, or at least raised on a footstool, when swelling and pain are present.  Try to avoid use other than gentle range of motion while the foot is painful. Do not resume use until instructed by your caregiver. Then begin use gradually, not increasing use to the point of pain. If pain does develop, decrease use and continue the above measures, gradually increasing activities that do not cause discomfort, until you gradually achieve normal use.  Use crutches if and as instructed, and for the length of time instructed.  Keep injured foot and ankle wrapped between treatments.  Massage foot and ankle for comfort and to keep swelling down. Massage from the toes up towards the knee.  Only take over-the-counter or prescription medicines for pain, discomfort, or fever as directed by your caregiver. SEEK IMMEDIATE MEDICAL CARE IF:   Your pain and swelling increase, or pain is not controlled with medications.  You have loss of feeling in your foot or your foot turns cold or blue.  You develop new, unexplained symptoms, or an increase of the symptoms that brought you to your caregiver. MAKE SURE YOU:   Understand these instructions.  Will watch your condition.  Will get help right away if you are not doing well or get worse. Document Released:   07/04/2001 Document Revised: 04/06/2011 Document Reviewed: 09/01/2007 [employer] Patient Information 2014 [employer].  

## 2013-07-06 NOTE — ED Notes (Signed)
Injury to left great toe yesterday.

## 2013-07-06 NOTE — ED Provider Notes (Addendum)
CSN: [id number]     Arrival date & time 07/06/13  8144 History   First MD Initiated Contact with Patient 07/06/13 [phone]     Chief Complaint  Patient presents with  . Toe Injury     (Consider location/radiation/quality/duration/timing/severity/associated sxs/prior Treatment) Patient is a 53 y.o. male presenting with toe pain. The history is provided by the patient.  Toe Pain This is a new (was at work and stopped on a block that rolled bending all this toes backwards and then later stepped on some cardboard while removing trash and bent toes backwards a second time.  severe pain in the toe since) problem. The current episode started yesterday. The problem occurs constantly. The problem has not changed since onset.Associated symptoms comments: Pain over the left great toe with swelling. The symptoms are aggravated by walking, bending and standing. The symptoms are relieved by rest. He has tried rest for the symptoms. The treatment provided no relief.    Past Medical History  Diagnosis Date  . Chronic back pain   . Knee pain   . DDD (degenerative disc disease)   . Hyperlipemia    Past Surgical History  Procedure Laterality Date  . Back surgery     No family history on file. History  Substance Use Topics  . Smoking status: Current Every Day Smoker -- 2.00 packs/day    Types: Cigarettes  . Smokeless tobacco: Not on file  . Alcohol Use: No    Review of Systems  All other systems reviewed and are negative.     Allergies  Celecoxib and Clarithromycin  Home Medications   Prior to Admission medications   Medication Sig Start Date End Date Taking? Authorizing Provider  atorvastatin (LIPITOR) 40 MG tablet Take 40 mg by mouth daily.   Yes Historical Provider, MD  diazepam (VALIUM) 5 MG/ML solution Take 5 mg by mouth every 8 (eight) hours as needed.   Yes Historical Provider, MD  OXYCODONE HCL PO Take by mouth.   Yes Historical Provider, MD  TiZANidine HCl (ZANAFLEX PO) Take by  mouth.   Yes Historical Provider, MD  doxycycline (VIBRAMYCIN) 100 MG capsule Take 1 capsule (100 mg total) by mouth 2 (two) times daily. 03/29/12   Hilario Quarry, MD   There were no vitals taken for this visit. Physical Exam  Nursing note and vitals reviewed. Constitutional: He is oriented to person, place, and time. He appears well-developed and well-nourished. No distress.  HENT:  Head: Normocephalic and atraumatic.  Cardiovascular: Normal rate.   Pulmonary/Chest: Effort normal.  Musculoskeletal:       Left foot: He exhibits decreased range of motion, tenderness, bony tenderness and swelling. He exhibits normal capillary refill and no deformity.       Feet:  Minimal swelling over 1st MTP and pain with flex/ext of the left great toe.  Nail intact and <2sec cap refill  Neurological: He is alert and oriented to person, place, and time.  Skin: Skin is warm and dry.  Psychiatric: He has a normal mood and affect. His behavior is normal.    ED Course  Procedures (including critical care time) Labs Review Labs Reviewed - No data to display  Imaging Review Dg Foot Complete Left  07/06/2013   CLINICAL DATA:  Injury great toe  EXAM: LEFT FOOT - COMPLETE 3+ VIEW  COMPARISON:  None.  FINDINGS: Negative for fracture. Mild degenerative change in the first MTP joint. No foreign body  IMPRESSION: Negative.   Electronically Signed  By: Marlan Palauharles  Clark M.D.   On: 07/06/2013 10:03     EKG Interpretation None      MDM   Final diagnoses:  Sprain of metatarsophalangeal joint of toe   Patient presenting with an injury to his left great toe at work yesterday. The toes got flexed backwards 2 separate times and since that time he had severe pain when bending the great toe or attempting to walk. On exam he has pain over the MTP joint and pain is exacerbated with flexion and extension of the left great toe. Neurovascularly intact and no other injuries identified at this time. Plain films  pending  10:07 AM Imaging neg.  Will have pt do supportive measures with anti-inflamatories and pain control with elevation and ice and f/u.  Gwyneth SproutWhitney Muaz Shorey, MD 07/06/13 1007  Gwyneth SproutWhitney Hikeem Andersson, MD 07/06/13 1013

## 2014-02-19 ENCOUNTER — Ambulatory Visit (INDEPENDENT_AMBULATORY_CARE_PROVIDER_SITE_OTHER): Payer: BC Managed Care – PPO

## 2014-02-19 ENCOUNTER — Other Ambulatory Visit: Payer: Self-pay | Admitting: Family Medicine

## 2014-02-19 DIAGNOSIS — R0602 Shortness of breath: Secondary | ICD-10-CM

## 2018-11-16 ENCOUNTER — Encounter (HOSPITAL_BASED_OUTPATIENT_CLINIC_OR_DEPARTMENT_OTHER): Payer: Self-pay | Admitting: *Deleted

## 2018-11-16 ENCOUNTER — Other Ambulatory Visit: Payer: Self-pay

## 2018-11-16 ENCOUNTER — Emergency Department (HOSPITAL_BASED_OUTPATIENT_CLINIC_OR_DEPARTMENT_OTHER): Payer: No Typology Code available for payment source

## 2018-11-16 ENCOUNTER — Emergency Department (HOSPITAL_BASED_OUTPATIENT_CLINIC_OR_DEPARTMENT_OTHER)
Admission: EM | Admit: 2018-11-16 | Discharge: 2018-11-16 | Disposition: A | Discharge status: Home / Self Care | Payer: No Typology Code available for payment source | Attending: Emergency Medicine | Admitting: Emergency Medicine

## 2018-11-16 DIAGNOSIS — I10 Essential (primary) hypertension: Secondary | ICD-10-CM | POA: Diagnosis not present

## 2018-11-16 DIAGNOSIS — Z79899 Other long term (current) drug therapy: Secondary | ICD-10-CM | POA: Diagnosis not present

## 2018-11-16 DIAGNOSIS — Y92414 Local residential or business street as the place of occurrence of the external cause: Secondary | ICD-10-CM | POA: Insufficient documentation

## 2018-11-16 DIAGNOSIS — S51822A Laceration with foreign body of left forearm, initial encounter: Secondary | ICD-10-CM | POA: Diagnosis present

## 2018-11-16 DIAGNOSIS — Z87891 Personal history of nicotine dependence: Secondary | ICD-10-CM | POA: Insufficient documentation

## 2018-11-16 DIAGNOSIS — Z7984 Long term (current) use of oral hypoglycemic drugs: Secondary | ICD-10-CM | POA: Diagnosis not present

## 2018-11-16 DIAGNOSIS — Y9389 Activity, other specified: Secondary | ICD-10-CM | POA: Insufficient documentation

## 2018-11-16 DIAGNOSIS — Y990 Civilian activity done for income or pay: Secondary | ICD-10-CM | POA: Insufficient documentation

## 2018-11-16 MED ORDER — CLINDAMYCIN HCL 300 MG PO CAPS: 300.0000 mg | ORAL_CAPSULE | Freq: Three times a day (TID) | ORAL | 0 refills | Status: AC

## 2018-11-16 MED ORDER — BACITRACIN ZINC 500 UNIT/GM EX OINT
TOPICAL_OINTMENT | Freq: Two times a day (BID) | CUTANEOUS | Status: DC
  Administered 2018-11-16: 1 via TOPICAL

## 2018-11-16 MED ORDER — LIDOCAINE HCL (PF) 1 % IJ SOLN
INTRAMUSCULAR | Status: AC
  Administered 2018-11-16: 12:00:00
  Filled 2018-11-16: qty 5

## 2018-11-16 MED ORDER — LIDOCAINE HCL (PF) 1 % IJ SOLN
5.0000 mL | Freq: Once | INTRAMUSCULAR | Status: AC
  Administered 2018-11-16: 11:00:00 5 mL
  Filled 2018-11-16: qty 5

## 2018-11-16 MED FILL — CLINDAMYCIN HCL 300 MG CAPS: 300 | 5 days supply | Qty: 15 | Fill #0

## 2018-11-16 NOTE — Discharge Instructions (Addendum)
Keep wound clean and dry.  Clean with Dove soap on a Q-tip if needed.  Do not use peroxide.  Apply bacitracin to wound as directed. Recommend wound check with your [employer] provider in 2 days.  Staple removal in 10 to 12 days. Take antibiotics as prescribed and complete the full course. Eat a yogurt with probiotics or take an oral probiotic supplement while taking antibiotics.  Antibiotics can cause diarrhea.
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
## 2019-02-27 LAB — HEMOGLOBIN A1C: Hemoglobin A1C: 6.4

## 2019-04-06 ENCOUNTER — Encounter: Payer: Self-pay | Admitting: Family Medicine

## 2020-06-09 IMAGING — DX DG FOREARM 2V*L*
2 series · 2 of 2 positions shown · non-contrast
Comparison: None.

CLINICAL DATA: Laceration to forearm

EXAM:
LEFT FOREARM - 2 VIEW

[forearm ap]
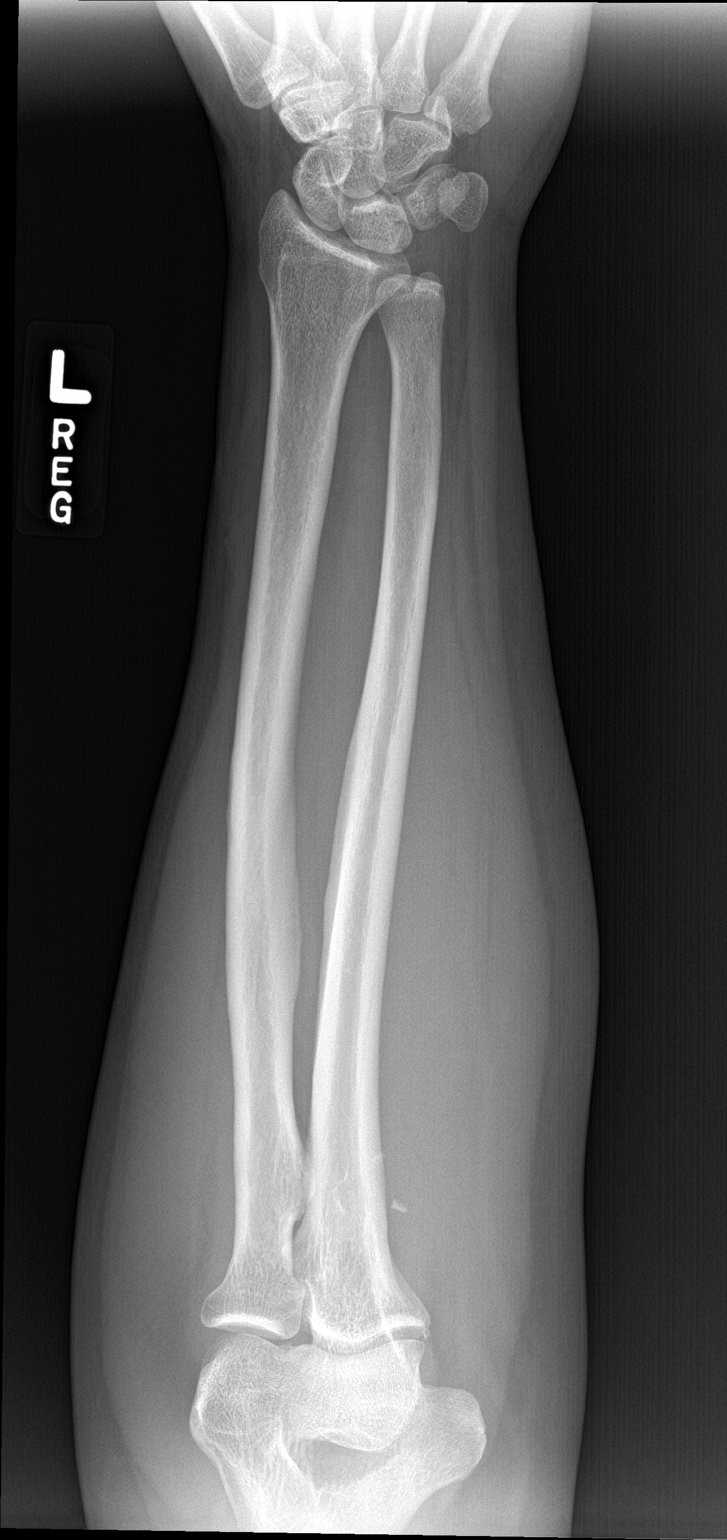

[forearm lat]
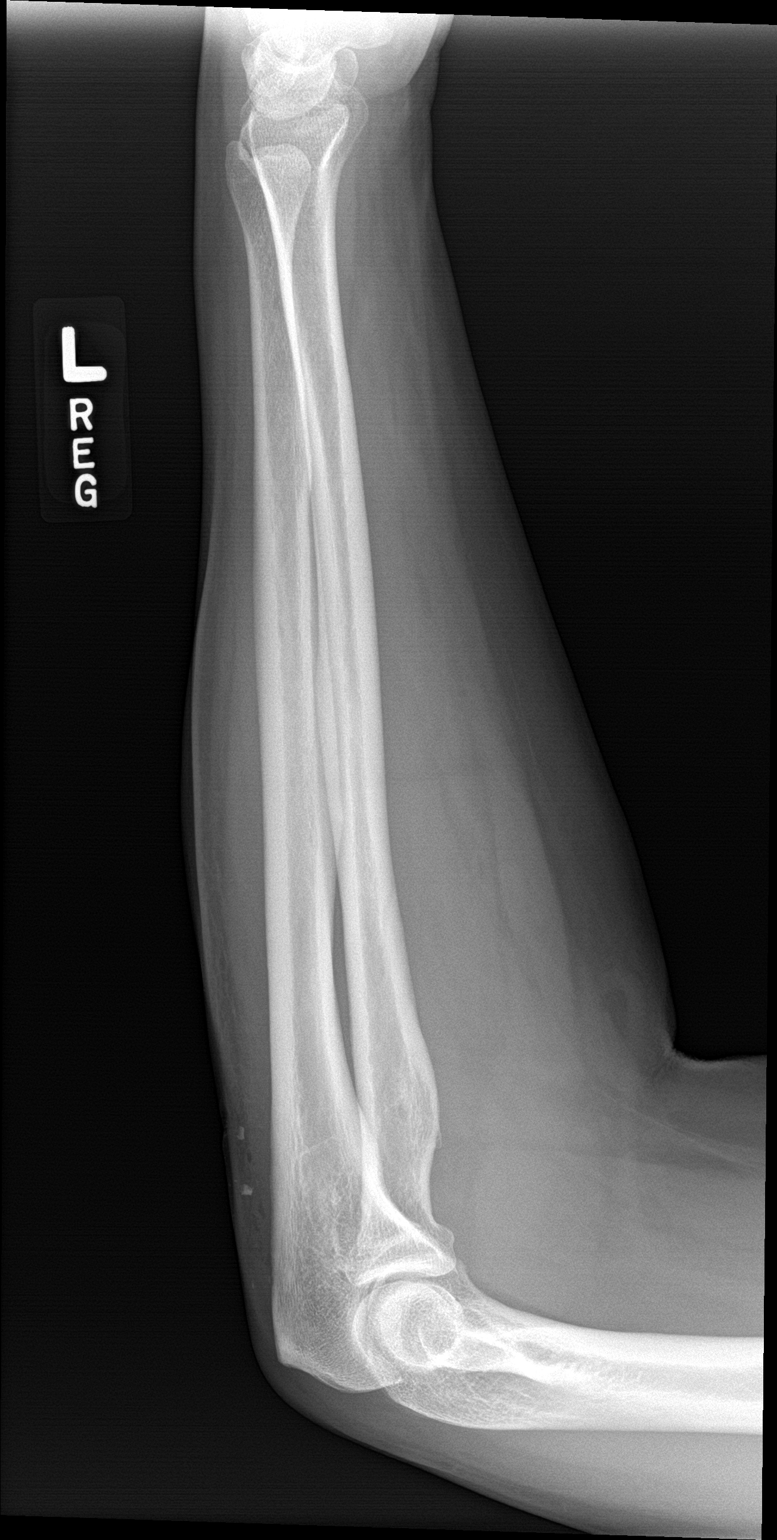

[2 of 2 positions shown; findings below may reference images not displayed]

FINDINGS: There are small radiopaque foreign bodies within the posterior soft
tissues within the proximal left forearm. No fracture, subluxation
or dislocation. No joint effusion within the left elbow.
IMPRESSION: Small radiopaque foreign bodies within the posterior soft tissues in
the left proximal forearm.
